# Patient Record
Sex: Female | Born: 2000 | Race: White | Hispanic: Yes | Marital: Single | Smoking: Never smoker
Health system: Southern US, Community
[De-identification: ages and names within clinical notes are randomized; demographics above are authoritative.]

## PROBLEM LIST (undated history)

## (undated) DIAGNOSIS — J45909 Unspecified asthma, uncomplicated: Secondary | ICD-10-CM

---
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
## 2022-12-09 IMAGING — DX DG CHEST 1V PORT
1 series · 2 of 2 positions shown · non-contrast
Comparison: 08/02/2015

CLINICAL DATA: Shortness of breath. Generalized chest pain. Head
feels heavy with burning sensation. Nausea and shortness of breath.
Positive COVID last week.

EXAM:
PORTABLE CHEST 1 VIEW

[Series 1: chest ap · 0.14mm/px · 2 of 2 slices shown]
[im 1/2]
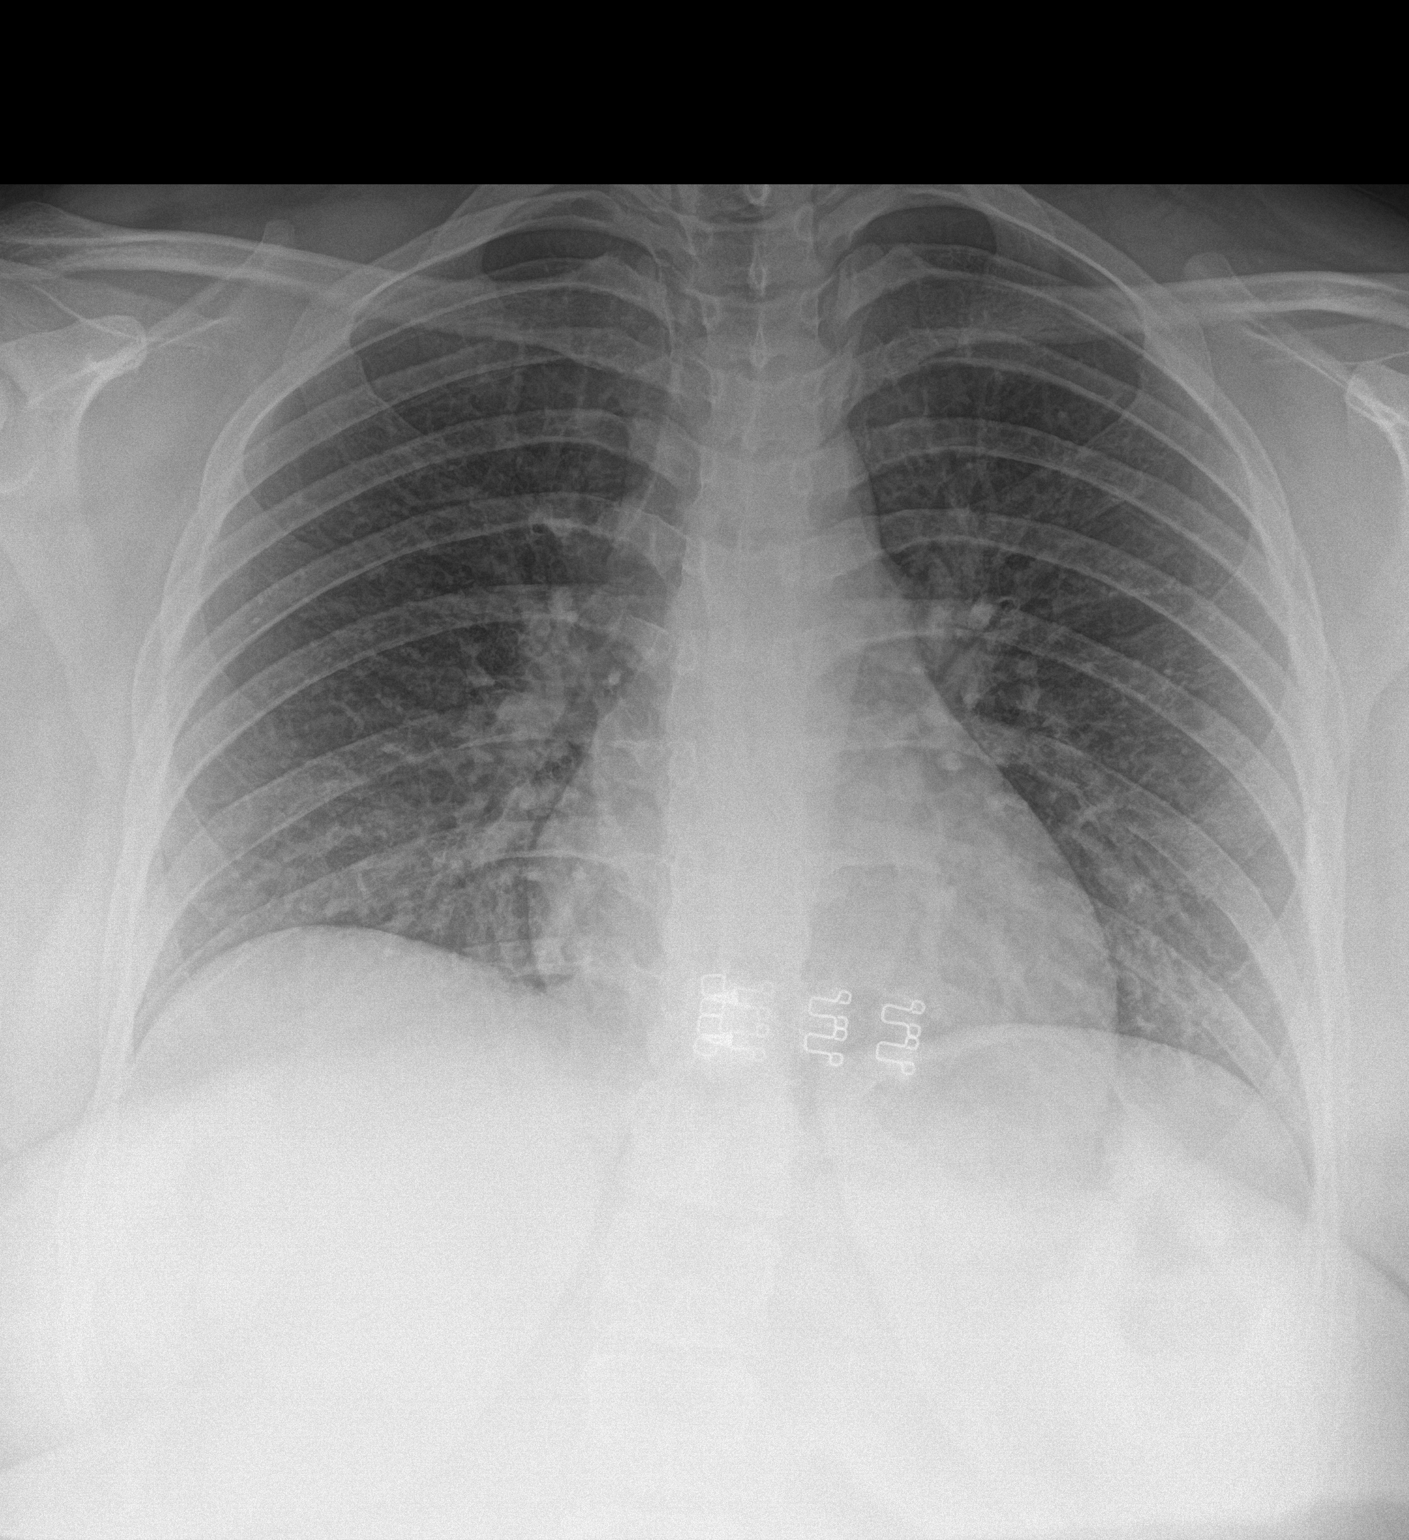
[im 2/2]
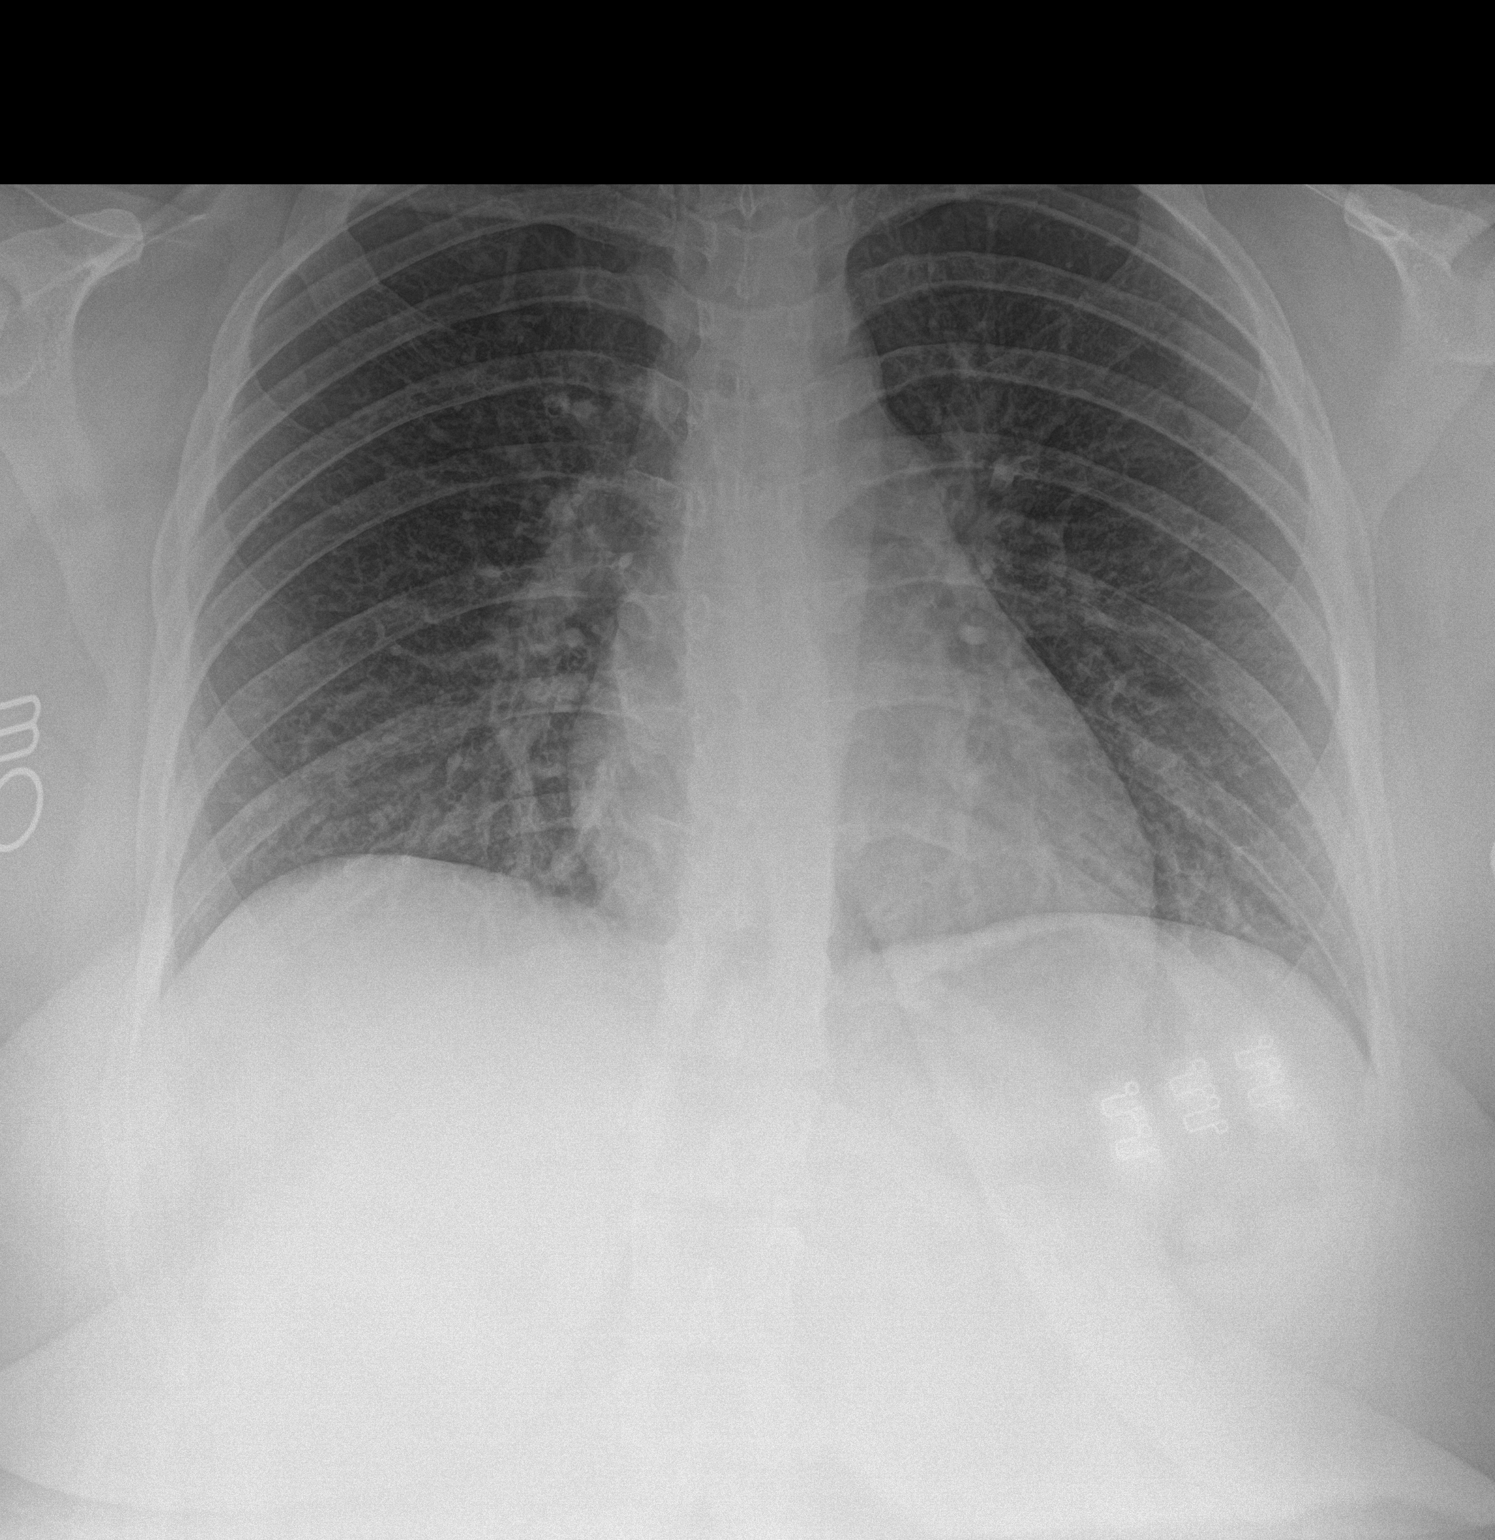

[2 of 2 positions shown; findings below may reference images not displayed]

FINDINGS: Heart size and pulmonary vascularity are normal. Mild interstitial
pattern to the lungs could represent multifocal pneumonia or edema.
No pleural effusions. No pneumothorax. Mediastinal contours appear
intact.
IMPRESSION: Mild interstitial pattern to the lungs could represent multifocal
pneumonia or edema.

## 2023-03-30 ENCOUNTER — Other Ambulatory Visit: Payer: Self-pay

## 2023-03-30 ENCOUNTER — Emergency Department (HOSPITAL_COMMUNITY)
Admission: EM | Admit: 2023-03-30 | Discharge: 2023-03-30 | Disposition: A | Discharge status: Home / Self Care | Payer: Medicaid Other | Source: Home / Self Care | Attending: Emergency Medicine | Admitting: Emergency Medicine

## 2023-03-30 ENCOUNTER — Encounter (HOSPITAL_COMMUNITY): Payer: Self-pay | Admitting: Emergency Medicine

## 2023-03-30 DIAGNOSIS — R519 Headache, unspecified: Secondary | ICD-10-CM | POA: Diagnosis present

## 2023-03-30 DIAGNOSIS — R000 Tachycardia, unspecified: Secondary | ICD-10-CM | POA: Insufficient documentation

## 2023-03-30 DIAGNOSIS — G43909 Migraine, unspecified, not intractable, without status migrainosus: Secondary | ICD-10-CM | POA: Insufficient documentation

## 2023-03-30 DIAGNOSIS — R030 Elevated blood-pressure reading, without diagnosis of hypertension: Secondary | ICD-10-CM | POA: Insufficient documentation

## 2023-03-30 LAB — CBC WITH DIFFERENTIAL/PLATELET
Abs Immature Granulocytes: 0.05 10*3/uL (ref 0.00–0.07)
Basophils Absolute: 0.1 10*3/uL (ref 0.0–0.1)
Basophils Relative: 0 %
Eosinophils Absolute: 0.3 10*3/uL (ref 0.0–0.5)
Eosinophils Relative: 2 %
HCT: 37 % (ref 36.0–46.0)
Hemoglobin: 10.8 g/dL — ABNORMAL LOW (ref 12.0–15.0)
Immature Granulocytes: 0 %
Lymphocytes Relative: 27 %
Lymphs Abs: 3 10*3/uL (ref 0.7–4.0)
MCH: 20.6 pg — ABNORMAL LOW (ref 26.0–34.0)
MCHC: 29.2 g/dL — ABNORMAL LOW (ref 30.0–36.0)
MCV: 70.5 fL — ABNORMAL LOW (ref 80.0–100.0)
Monocytes Absolute: 0.8 10*3/uL (ref 0.1–1.0)
Monocytes Relative: 7 %
Neutro Abs: 7.1 10*3/uL (ref 1.7–7.7)
Neutrophils Relative %: 64 %
Platelets: 412 10*3/uL — ABNORMAL HIGH (ref 150–400)
RBC: 5.25 MIL/uL — ABNORMAL HIGH (ref 3.87–5.11)
RDW: 17.7 % — ABNORMAL HIGH (ref 11.5–15.5)
WBC: 11.4 10*3/uL — ABNORMAL HIGH (ref 4.0–10.5)
nRBC: 0 % (ref 0.0–0.2)

## 2023-03-30 LAB — BASIC METABOLIC PANEL
Anion gap: 9 (ref 5–15)
BUN: 12 mg/dL (ref 6–20)
CO2: 21 mmol/L — ABNORMAL LOW (ref 22–32)
Calcium: 9.2 mg/dL (ref 8.9–10.3)
Chloride: 106 mmol/L (ref 98–111)
Creatinine, Ser: 0.57 mg/dL (ref 0.44–1.00)
GFR, Estimated: >60 mL/min (ref >60)
Glucose, Bld: 102 mg/dL — ABNORMAL HIGH (ref 70–99)
Potassium: 4.3 mmol/L (ref 3.5–5.1)
Sodium: 136 mmol/L (ref 135–145)

## 2023-03-30 MED ORDER — AMLODIPINE BESYLATE 5 MG PO TABS
5.0000 mg | ORAL_TABLET | Freq: Once | ORAL | Status: AC
  Administered 2023-03-30: 5 mg via ORAL
  Filled 2023-03-30: qty 1

## 2023-03-30 MED ORDER — KETOROLAC TROMETHAMINE 15 MG/ML IJ SOLN
15.0000 mg | Freq: Once | INTRAMUSCULAR | Status: AC
  Administered 2023-03-30: 15 mg via INTRAVENOUS
  Filled 2023-03-30: qty 1

## 2023-03-30 MED ORDER — DEXAMETHASONE SODIUM PHOSPHATE 10 MG/ML IJ SOLN
10.0000 mg | Freq: Once | INTRAMUSCULAR | Status: AC
  Administered 2023-03-30: 10 mg via INTRAVENOUS
  Filled 2023-03-30: qty 1

## 2023-03-30 MED ORDER — SODIUM CHLORIDE 0.9 % IV BOLUS: 500.0000 mL | Freq: Once | INTRAVENOUS | Status: DC

## 2023-03-30 MED ORDER — DIPHENHYDRAMINE HCL 50 MG/ML IJ SOLN
25.0000 mg | Freq: Once | INTRAMUSCULAR | Status: AC
  Administered 2023-03-30: 25 mg via INTRAVENOUS
  Filled 2023-03-30: qty 1

## 2023-03-30 MED ORDER — METOCLOPRAMIDE HCL 5 MG/ML IJ SOLN
10.0000 mg | Freq: Once | INTRAMUSCULAR | Status: AC
  Administered 2023-03-30: 10 mg via INTRAVENOUS
  Filled 2023-03-30: qty 2

## 2023-03-30 MED ORDER — SODIUM CHLORIDE 0.9 % IV BOLUS
1000.0000 mL | Freq: Once | INTRAVENOUS | Status: AC
  Administered 2023-03-30: 1000 mL via INTRAVENOUS

## 2023-03-30 NOTE — ED Triage Notes (Signed)
Pt c/o headache since 0200 unrelieved by aleve.

## 2023-03-30 NOTE — Discharge Instructions (Addendum)
Thank you for letting us take care of you today.  Your labs did not reveal a cause of your headaches. With your description of your headaches, I suspect you may have migraines or another chronic headache disorder. I have referred you to neurology for further evaluation and treatment. I also recommend establishing a primary care provider. I provided 2 clinics you may schedule this appointment with or you  may go to a PCP of your own choosing. See attached headache record. When you have headaches, record them as described and take this to your follow up appointment. There are also apps you may download with similar features.   Your blood pressure today was initially very elevated. It responded well with a small dose of a blood pressure medication. Since it is back to normal, I will not put you on blood pressure medication at this time but do recommend checking it a few times a week to see if it stays high frequently. You can check it at most pharmacies which have blood pressure machines or may purchase a blood pressure cuff at the pharmacy or online. See attached instructions for how to take your blood pressure. I would also keep a log of this to take to your follow up appointment with a primary care. If it remains elevated, they may want to place you on blood pressure medication.  For new or worsening symptoms including fever, chest pain, shortness of breath, severe uncontrolled headaches, uncontrollable vomiting, weakness on one side of your body, drooping of your face, vision changes, or other new, concerning symptoms, return to nearest ED for re-evaluation.

## 2023-03-30 NOTE — ED Notes (Signed)
Pt verbalized understanding of discharge instructions. Pt ambulated from the ed with a steady gait.

## 2023-03-30 NOTE — ED Provider Notes (Signed)
[place] EMERGENCY DEPARTMENT AT [facility] Provider Note   CSN: [id number] Arrival date & time: 03/30/23  6578     History  Chief Complaint  Patient presents with   Headache    Debra Fields is a 22 y.o. female with no PMH who presents to ED planing of right frontal headache since early this morning with associated photophobia.  She states that she gets these headaches frequently but has never been evaluated for them before.  Typically she will take Aleve for them and does so frequently but did not have any relief with that this morning. She was awake when the headache started but it has been severe enough that she has been unable to go to sleep since 2 AM.  No associated nausea, vomiting, chest pain, shortness of breath, paresthesias, visual changes, or focal weakness.  Denies chance of pregnancy.  Not currently followed by a primary care.  She is notably hypertensive in the ED today.  She states that she has no history of diagnosed hypertension.      Home Medications No daily medications  Allergies    Patient has no known allergies.    Review of Systems   Review of Systems  All other systems reviewed and are negative.   Physical Exam Updated Vital Signs BP 137/80 (BP Location: Left Arm)   Pulse (!) 110   Temp 98.5 F (36.9 C) (Oral)   Resp 18   SpO2 99%  Physical Exam Vitals and nursing note reviewed.  Constitutional:      General: She is not in acute distress.    Appearance: Normal appearance. She is not ill-appearing, toxic-appearing or diaphoretic.  HENT:     Head: Normocephalic and atraumatic.     Mouth/Throat:     Mouth: Mucous membranes are moist.  Eyes:     Extraocular Movements: Extraocular movements intact.     Right eye: No nystagmus.     Left eye: No nystagmus.     Conjunctiva/sclera: Conjunctivae normal.     Pupils: Pupils are equal, round, and reactive to light.  Cardiovascular:     Rate and Rhythm: Regular rhythm.  Tachycardia present.     Heart sounds: No murmur heard. Pulmonary:     Effort: Pulmonary effort is normal.     Breath sounds: Normal breath sounds.  Abdominal:     General: Abdomen is flat.     Palpations: Abdomen is soft.     Tenderness: There is no abdominal tenderness.  Musculoskeletal:        General: Normal range of motion.     Cervical back: Normal range of motion and neck supple. No rigidity.     Right lower leg: No edema.     Left lower leg: No edema.  Skin:    General: Skin is warm and dry.     Capillary Refill: Capillary refill takes less than 2 seconds.  Neurological:     Mental Status: She is alert and oriented to person, place, and time.     GCS: GCS eye subscore is 4. GCS verbal subscore is 5. GCS motor subscore is 6.     Cranial Nerves: Cranial nerves 2-12 are intact. No cranial nerve deficit, dysarthria or facial asymmetry.     Sensory: Sensation is intact.     Motor: Motor function is intact. No weakness, tremor, atrophy, abnormal muscle tone or seizure activity.     Coordination: Coordination is intact.  Psychiatric:        Behavior:  Behavior normal.     ED Results / Procedures / Treatments   Labs (all labs ordered are listed, but only abnormal results are displayed) Labs Reviewed  CBC WITH DIFFERENTIAL/PLATELET - Abnormal; Notable for the following components:      Result Value   WBC 11.4 (*)    RBC 5.25 (*)    Hemoglobin 10.8 (*)    MCV 70.5 (*)    MCH 20.6 (*)    MCHC 29.2 (*)    RDW 17.7 (*)    Platelets 412 (*)    All other components within normal limits  BASIC METABOLIC PANEL - Abnormal; Notable for the following components:   CO2 21 (*)    Glucose, Bld 102 (*)    All other components within normal limits  HCG, SERUM, QUALITATIVE    EKG None  Radiology No results found.  Procedures Procedures    Medications Ordered in ED Medications  ketorolac (TORADOL) 15 MG/ML injection 15 mg (15 mg Intravenous Given 03/30/23 0810)   metoCLOPramide (REGLAN) injection 10 mg (10 mg Intravenous Given 03/30/23 0809)  dexamethasone (DECADRON) injection 10 mg (10 mg Intravenous Given 03/30/23 0806)  diphenhydrAMINE (BENADRYL) injection 25 mg (25 mg Intravenous Given 03/30/23 0808)  sodium chloride 0.9 % bolus 1,000 mL (0 mLs Intravenous Stopped 03/30/23 0946)  amLODipine (NORVASC) tablet 5 mg (5 mg Oral Given 03/30/23 0805)    ED Course/ Medical Decision Making/ A&P                             Medical Decision Making Amount and/or Complexity of Data Reviewed Labs: ordered. Decision-making details documented in ED Course.  Risk Prescription drug management.   Medical Decision Making:   Debra Fields is a 22 y.o. female who presented to the ED today with headache detailed above.    Additional history discussed with patient's family/caregivers.  Complete initial physical exam performed, notably the patient was in no acute distress.  Nontoxic-appearing.  Mildly tachycardic with regular rhythm.  Neurologically intact.  No meningismus.    Reviewed and confirmed nursing documentation for past medical history, family history, social history.    Initial Assessment:   With the patient's presentation of headache, differential diagnosis includes but is not limited to emergent considerations for headache include subarachnoid hemorrhage, meningitis, temporal arteritis, glaucoma, cerebral ischemia, carotid/vertebral dissection, intracranial tumor, venous sinus thrombosis, carbon monoxide poisoning, acute or chronic subdural hemorrhage.  Other considerations include: migraine, cluster headache, hypertension, caffeine, alcohol, or drug withdrawal, pseudotumor cerebri, arteriovenous malformation, head injury, neurocysticercosis, post-lumbar puncture, preeclampsia in those assigned female at birth and in reproductive age, tension headache, viral vs acute bacterial sinusitis, cervical arthritis, refractive error causing strain,  temporomandibular joint syndrome, depression, somatoform disorder (eg, somatization), trigeminal neuralgia, glossopharyngeal neuralgia.  This is most consistent with an acute complicated illness  Initial Plan:  Screening labs including CBC and Metabolic panel to evaluate for infectious or metabolic etiology of disease.  Symptomatic management Objective evaluation as below reviewed   Initial Study Results:   Laboratory  All laboratory results reviewed without evidence of clinically relevant pathology.   Exceptions include: WBC 11.4, hemoglobin 10.8  Final Assessment and Plan:   22 year old female presents to the ED complaining of right frontal headache.  She has had these intermittently for years.  Not currently followed by an outpatient medical team.  Neurologically intact on exam.  No associated volume losses.  She is initially found to be tachycardic.  Upon  chart review, patient appears to typically be tachycardic between 95 to 110 bpm but has been up to the 140s before.  States that she has never been evaluated for this.  Is not complaining of acute chest pain, shortness of breath, or palpitations.  EKG with sinus tachycardia.  Stressed need for outpatient follow-up regarding this.  She is also initially hypertensive.  This improved to 137/80 with 5 mg of amlodipine.  Is unsure if her blood pressure is typically high.  As she did have fairly rapid decline in blood pressure with a small dose of antihypertensive, will allow her to monitor at home but stressed the importance of close outpatient follow-up for possible blood pressure control should it remain high in the coming weeks.  On labs, she has no significant electrolyte disturbance, no AKI.  Very minimal leukocytosis.  Hemoglobin 10.8.  States that she does have a history of iron deficiency anemia.  No active bleeding.  Afebrile.  No meningismus.  No recent infectious symptoms.  Patient's headache much improved with headache cocktail given in the  ED in addition to fluids.  Remains neurologically intact.  With history of frequent headaches, will refer to neurology for better headache control.  Patient to keep headache log until then.  Also provided with primary care follow-up for routine health maintenance as well as recheck of blood pressure.  Patient expressed understanding and agreement with plan.  Strict ED return precautions given, all questions answered, and stable for discharge.   Clinical Impression:  1. Migraine without status migrainosus, not intractable, unspecified migraine type   2. Elevated blood pressure reading without diagnosis of hypertension      Discharge           Final Clinical Impression(s) / ED Diagnoses Final diagnoses:  Migraine without status migrainosus, not intractable, unspecified migraine type  Elevated blood pressure reading without diagnosis of hypertension    Rx / DC Orders ED Discharge Orders          Ordered    Ambulatory referral to Neurology       Comments: An appointment is requested in approximately: 4 weeks   03/30/23 0943              Tonette Lederer, PA-C 03/30/23 1026    Laurence Spates, MD 03/30/23 [phone]

## 2023-09-01 ENCOUNTER — Encounter (HOSPITAL_COMMUNITY): Payer: Self-pay

## 2023-09-01 ENCOUNTER — Other Ambulatory Visit: Payer: Self-pay

## 2023-09-01 ENCOUNTER — Emergency Department (HOSPITAL_COMMUNITY)
Admission: EM | Admit: 2023-09-01 | Discharge: 2023-09-01 | Disposition: A | Discharge status: Home / Self Care | Payer: Medicaid Other | Attending: Emergency Medicine | Admitting: Emergency Medicine

## 2023-09-01 DIAGNOSIS — R000 Tachycardia, unspecified: Secondary | ICD-10-CM | POA: Diagnosis not present

## 2023-09-01 DIAGNOSIS — R519 Headache, unspecified: Secondary | ICD-10-CM

## 2023-09-01 DIAGNOSIS — D72829 Elevated white blood cell count, unspecified: Secondary | ICD-10-CM | POA: Insufficient documentation

## 2023-09-01 DIAGNOSIS — G44219 Episodic tension-type headache, not intractable: Secondary | ICD-10-CM | POA: Insufficient documentation

## 2023-09-01 DIAGNOSIS — R002 Palpitations: Secondary | ICD-10-CM | POA: Insufficient documentation

## 2023-09-01 DIAGNOSIS — J45909 Unspecified asthma, uncomplicated: Secondary | ICD-10-CM | POA: Insufficient documentation

## 2023-09-01 LAB — CBC
HCT: 41.1 % (ref 36.0–46.0)
Hemoglobin: 12.8 g/dL (ref 12.0–15.0)
MCH: 22.5 pg — ABNORMAL LOW (ref 26.0–34.0)
MCHC: 31.1 g/dL (ref 30.0–36.0)
MCV: 72.1 fL — ABNORMAL LOW (ref 80.0–100.0)
Platelets: 423 10*3/uL — ABNORMAL HIGH (ref 150–400)
RBC: 5.7 MIL/uL — ABNORMAL HIGH (ref 3.87–5.11)
RDW: 18 % — ABNORMAL HIGH (ref 11.5–15.5)
WBC: 13.1 10*3/uL — ABNORMAL HIGH (ref 4.0–10.5)
nRBC: 0 % (ref 0.0–0.2)

## 2023-09-01 LAB — COMPREHENSIVE METABOLIC PANEL
ALT: 16 U/L (ref 0–44)
AST: 19 U/L (ref 15–41)
Albumin: 3.9 g/dL (ref 3.5–5.0)
Alkaline Phosphatase: 90 U/L (ref 38–126)
Anion gap: 11 (ref 5–15)
BUN: 11 mg/dL (ref 6–20)
CO2: 19 mmol/L — ABNORMAL LOW (ref 22–32)
Calcium: 9.7 mg/dL (ref 8.9–10.3)
Chloride: 106 mmol/L (ref 98–111)
Creatinine, Ser: 0.61 mg/dL (ref 0.44–1.00)
GFR, Estimated: >60 mL/min (ref >60)
Glucose, Bld: 94 mg/dL (ref 70–99)
Potassium: 3.6 mmol/L (ref 3.5–5.1)
Sodium: 136 mmol/L (ref 135–145)
Total Bilirubin: 0.5 mg/dL (ref <1.2)
Total Protein: 8.3 g/dL — ABNORMAL HIGH (ref 6.5–8.1)

## 2023-09-01 LAB — ACETAMINOPHEN LEVEL: Acetaminophen (Tylenol), Serum: 14 ug/mL (ref 10–30)

## 2023-09-01 LAB — HCG, SERUM, QUALITATIVE: Preg, Serum: NEGATIVE

## 2023-09-01 LAB — D-DIMER, QUANTITATIVE: D-Dimer, Quant: <0.27 ug{FEU}/mL (ref 0.00–0.50)

## 2023-09-01 LAB — SALICYLATE LEVEL: Salicylate Lvl: 10.1 mg/dL (ref 7.0–30.0)

## 2023-09-01 MED ORDER — SODIUM CHLORIDE 0.9 % IV BOLUS
1000.0000 mL | Freq: Once | INTRAVENOUS | Status: AC
  Administered 2023-09-01: 1000 mL via INTRAVENOUS

## 2023-09-01 MED ORDER — KETOROLAC TROMETHAMINE 30 MG/ML IJ SOLN
30.0000 mg | Freq: Once | INTRAMUSCULAR | Status: AC
  Administered 2023-09-01: 30 mg via INTRAVENOUS
  Filled 2023-09-01: qty 1

## 2023-09-01 NOTE — ED Provider Notes (Incomplete)
Emergency Department Provider Note   I have reviewed the triage vital signs and the nursing notes.   HISTORY  Chief Complaint Tachycardia   HPI Debra Fields is a 22 y.o. female with past history of asthma presents the emergency department for evaluation of heart palpitations and tachycardia.  Symptoms began overnight.  She did take Fioricet for headache which she developed yesterday.  She states she frequently gets headaches but has never taken Fioricet in the past.  She does not use much caffeine.  She developed symptoms shortly afterwards.  She felt some associated lightheadedness like she was going to pass out.  No leg pain or swelling.  She does have hormone birth control implant in place. No PE or DVT history.   Past Medical History:  Diagnosis Date   Asthma     Review of Systems  Constitutional: No fever/chills Cardiovascular: Denies chest pain. Positive palpitations and lightheadedness.  Respiratory: Denies shortness of breath. Gastrointestinal: No abdominal pain.  No nausea, no vomiting.   Musculoskeletal: Negative for back pain. Skin: Negative for rash. Neurological: Positive HA.  ____________________________________________   PHYSICAL EXAM:  VITAL SIGNS: ED Triage Vitals  Encounter Vitals Group     BP 09/01/23 0708 (!) 159/89     Pulse Rate 09/01/23 0708 (!) 121     Resp 09/01/23 0708 18     Temp 09/01/23 0708 97.8 F (36.6 C)     Temp src --      SpO2 09/01/23 0708 98 %     Weight --      Height 09/01/23 0725 5\' 6"  (1.676 m)   Constitutional: Alert and oriented. Well appearing and in no acute distress. Eyes: Conjunctivae are normal.  Head: Atraumatic. Ears:  Healthy appearing ear canals and TMs bilaterally Nose: No congestion/rhinnorhea. Mouth/Throat: Mucous membranes are moist.  Oropharynx non-erythematous. Neck: No stridor.   Cardiovascular: Tachycardia. Good peripheral circulation. Grossly normal heart sounds.   Respiratory: Normal  respiratory effort.  No retractions. Lungs CTAB. Gastrointestinal: Soft and nontender. No distention.  Musculoskeletal: No lower extremity tenderness nor edema. No gross deformities of extremities. Neurologic:  Normal speech and language. No gross focal neurologic deficits are appreciated.  Skin:  Skin is warm, dry and intact. No rash noted.  ____________________________________________   LABS (all labs ordered are listed, but only abnormal results are displayed)  Labs Reviewed  CBC - Abnormal; Notable for the following components:      Result Value   WBC 13.1 (*)    RBC 5.70 (*)    MCV 72.1 (*)    MCH 22.5 (*)    RDW 18.0 (*)    Platelets 423 (*)    All other components within normal limits  COMPREHENSIVE METABOLIC PANEL - Abnormal; Notable for the following components:   CO2 19 (*)    Total Protein 8.3 (*)    All other components within normal limits  HCG, SERUM, QUALITATIVE  ACETAMINOPHEN LEVEL  SALICYLATE LEVEL  D-DIMER, QUANTITATIVE   ____________________________________________  EKG   EKG Interpretation Date/Time:  Monday September 01 2023 07:01:08 EST Ventricular Rate:  101 PR Interval:  132 QRS Duration:  94 QT Interval:  364 QTC Calculation: 471 R Axis:   97  Text Interpretation: Sinus tachycardia Rightward axis Borderline ECG When compared with ECG of 30-Mar-2023 10:10, PREVIOUS ECG IS PRESENT Confirmed by Alona Bene [phone]) on 09/01/2023 9:35:02 AM        ____________________________________________  RADIOLOGY  No results found.  ____________________________________________   PROCEDURES  Procedure(s) performed:   Procedures   ____________________________________________   INITIAL IMPRESSION / ASSESSMENT AND PLAN / ED COURSE  Pertinent labs & imaging results that were available during my care of the patient were reviewed by me and considered in my medical decision making (see chart for details).   This patient is Presenting for  Evaluation of palpitations, which does require a range of treatment options, and is a complaint that involves a high risk of morbidity and mortality.  The Differential Diagnoses includes but is not exclusive to acute coronary syndrome, aortic dissection, pulmonary embolism, cardiac tamponade, community-acquired pneumonia, pericarditis, musculoskeletal chest wall pain, etc.   Critical Interventions-    Medications  sodium chloride 0.9 % bolus 1,000 mL (has no administration in time range)  ketorolac (TORADOL) 30 MG/ML injection 30 mg (has no administration in time range)    Reassessment after intervention:     I did obtain Additional Historical Information from Mom at bedside.  Clinical Laboratory Tests Ordered, included CBC without anemia.  Leukocytosis to 13.1 noted.  Pregnancy negative.  No acute kidney injury.  LFTs normal.  Radiologic Tests: Considered neuroimaging/CT for headache symptoms but patient frequently has these type of headaches.  It was gradual in onset and has mostly resolved at this point.  No neurodeficits or red flag signs/symptoms.  Cardiac Monitor Tracing which shows sinus tachycardia.    Social Determinants of Health Risk patient is a non-smoker. Denies drug use.   Consult complete with  Medical Decision Making: Summary:  Patient presents to the emergency department for evaluation of heart palpitations and dizziness.  No specific chest pain or shortness of breath but arrives with sinus tachycardia in the 120s.  Could be a reaction/intolerance to Fioricet.  Also considering PE.  She is low risk by [place] but is on hormone birth control and arrives with tachycardia.  Unable to apply the [facility] rule in this case and will send a D-dimer.  Plan to treat with IV fluids and Toradol for headache and reassess.  Reevaluation with update and discussion with   ***Considered admission***  Patient's presentation is most consistent with acute presentation with potential threat  to life or bodily function.   Disposition:   ____________________________________________  FINAL CLINICAL IMPRESSION(S) / ED DIAGNOSES  Final diagnoses:  None     NEW OUTPATIENT MEDICATIONS STARTED DURING THIS VISIT:  New Prescriptions   No medications on file    Note:  This document was prepared using Dragon voice recognition software and may include unintentional dictation errors.  Alona Bene, MD, [facility]

## 2023-09-01 NOTE — ED Triage Notes (Signed)
Pt c.o her heart racing since she woke up this morning. Pt also c.o feeling dizzy with a headache. Denies chest pain or sob.

## 2023-09-01 NOTE — Discharge Instructions (Signed)
You have been seen in the Emergency Department (ED) for a headache and palpitations.  Please use Tylenol or Motrin as needed for symptoms, but only as written on the box.  As we have discussed, please follow up with your primary care doctor as soon as possible regarding today's Emergency Department (ED) visit and your headache symptoms.    Call your doctor or return to the ED if you have a worsening headache, sudden and severe headache, confusion, slurred speech, facial droop, weakness or numbness in any arm or leg, extreme fatigue, vision problems, or other symptoms that concern you.

## 2023-09-23 ENCOUNTER — Encounter: Payer: Self-pay | Admitting: Neurology

## 2023-09-23 ENCOUNTER — Ambulatory Visit (INDEPENDENT_AMBULATORY_CARE_PROVIDER_SITE_OTHER): Payer: Medicaid Other | Admitting: Neurology

## 2023-09-23 VITALS — BP 139/87 | HR 90 | Ht 65.0 in | Wt 239.0 lb

## 2023-09-23 DIAGNOSIS — E669 Obesity, unspecified: Secondary | ICD-10-CM

## 2023-09-23 DIAGNOSIS — R030 Elevated blood-pressure reading, without diagnosis of hypertension: Secondary | ICD-10-CM

## 2023-09-23 DIAGNOSIS — R519 Headache, unspecified: Secondary | ICD-10-CM | POA: Diagnosis not present

## 2023-09-23 DIAGNOSIS — G4489 Other headache syndrome: Secondary | ICD-10-CM

## 2023-09-23 NOTE — Patient Instructions (Addendum)
Here is what we discussed today and my recommendations to you: Establish care with a primary care provider. There are medical conditions that can cause headaches, such as high blood pressure, your blood pressure was borderline today. Get a full dilated eye exam. Strain on the eyes can cause headaches.  You are probably due for your eye examination for this year. We will do a brain scan, called MRI and call you with the test results. We will have to schedule you for this on a separate date. This test requires authorization from your insurance, and we will take care of the insurance process. I will order a sleep study for evaluation of possible sleep apnea. If you have sleep apnea, I will likely recommend treatment with an autoPAP machine.  Avoid taking Excedrin migraine.   Increase water intake, as dehydration can worsen headaches.  I recommend you try to drink about 4 bottles of water per day, 16.9 ounce size each. Limit your caffeine intake as you are to up to 2 servings per day, as excessive caffeine can perpetuate headaches. We will keep you posted as to your test results by phone call and plan to follow-up in this clinic accordingly.

## 2023-09-23 NOTE — Progress Notes (Signed)
Subjective:    Patient ID: Debra Fields is a 23 y.o. female.  HPI    Huston Foley, MD, PhD [facility] [address], [address] [address] [place], [place] [address]  I saw patient, Debra Fields, as a referral from the emergency room for evaluation of headaches.  The patient is unaccompanied today.  [name] is a 23 year old female with an underlying medical history of asthma, palpitations, and obesity, who reports recurrent headaches for months.  Symptoms started in or around October 2024.  She reports often waking up with a headache and nocturnal headaches.  She snores per family.  She has never had a sleep study.  Headaches are typically not associated with neurological accompaniments such as one-sided weakness or numbness or tingling or droopy face or slurring of speech.  She does not typically have nausea or vomiting or photophobia and sonophobia.  A maternal aunt was recently diagnosed with migraines as far she knows.  No family history of sleep apnea.  Her Epworth sleepiness score is 7 out of 24, fatigue severity score is 24 out of 63.  She had an eye examination with [facility] last year.  She has prescription eyeglasses.  She tried sumatriptan hand through another provider, she does not know the name of the provider.  She did not think it helped her headaches, she had some drowsiness from it.  She takes Tylenol as needed which is fairly effective.  Blood pressure has been elevated at times.  She has not seen her PCP.  She used to see a pediatrician but has not seen her PCP. She hydrates with water, estimates that she drinks about 3 bottles of water per day, she is a non-smoker and does not drink alcohol, she does not vape.  She drinks caffeine in the form of soda, usually 1 can per day.  She goes to bed around 10 or 11 and rise time is around 6:30 AM.  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 She lives  with her grandmother. She presented to the emergency room on 09/01/2023 with a chief complaint of tachycardia.  She also reported recurrent headaches at the time.  She had taken Fioricet and developed palpitations afterwards.  I reviewed the emergency room records.  Her blood pressure initially was elevated at 159/89 and she had tachycardia with a pulse rate of 121.  She was treated symptomatically with Toradol.  She had blood work including CBC, salicylate level and acetaminophen level, CMP and beta-hCG, labs were benign for the most part but MCV and MCH were below normal, platelets slightly elevated at 423, WBC mildly elevated at 13.1.  She had presented to the emergency room at [facility] on 03/30/2023 with a chief complaint of headache.  She was noted to be hypertensive in the ED with tachycardia noted.  She was treated symptomatically at the time with Toradol, Reglan, Decadron, Benadryl, and was given Norvasc.     Her Past Medical History Is Significant For: Past Medical History:  Diagnosis Date   Asthma     Her Past Surgical History Is Significant For: History reviewed. No pertinent surgical history.  Her Family History Is Significant For: History reviewed. No pertinent family history.  Her Social History Is Significant For: Social History   Socioeconomic History   Marital status: Single    Spouse name: Not on file   Number of children: Not on file   Years of education: Not on  file   Highest education level: Not on file  Occupational History   Not on file  Tobacco Use   Smoking status: Never   Smokeless tobacco: Never  Substance and Sexual Activity   Alcohol use: No   Drug use: No   Sexual activity: Never    Birth control/protection: Abstinence  Other Topics Concern   Not on file  Social History Narrative   Not on file   Social Drivers of Health   Financial Resource Strain: Not on file  Food Insecurity: Not on file  Transportation Needs: Not on file  Physical  Activity: Not on file  Stress: Not on file  Social Connections: Not on file    Her Allergies Are:  No Known Allergies:   Her Current Medications Are:  Outpatient Encounter Medications as of 09/23/2023  Medication Sig   acetaminophen (TYLENOL) 325 MG tablet Take 650 mg by mouth every 6 (six) hours as needed.   aspirin-acetaminophen-caffeine (EXCEDRIN MIGRAINE) 250-250-65 MG tablet Take 1 tablet by mouth every 6 (six) hours as needed for headache.   No facility-administered encounter medications on file as of 09/23/2023.  :   Review of Systems:  Out of a complete 14 point review of systems, all are reviewed and negative with the exception of these symptoms as listed below:  Review of Systems  Neurological:        Patient in room #9 and alone. Patient states she been having very bad headaches for the pass four months. Patient states the pain starts at her left ear then travels up to her head.    Objective:  Neurological Exam  Physical Exam Physical Examination:   Vitals:   09/23/23 1354  BP: 139/87  Pulse: 90    General Examination: The patient is a very pleasant 23 y.o. female in no acute distress. She appears towell-developed and well-nourished and well groomed.   HEENT: Normocephalic, atraumatic, pupils are equal, round and reactive to light, no photophobia.  Funduscopic exam benign.  Corrective eyeglasses in place.  Extraocular tracking is good without limitation to gaze excursion or nystagmus noted. Hearing is grossly intact. Face is symmetric with normal facial animation and normal facial sensation to light touch, temperature and vibration sense. Speech is clear with no dysarthria noted. There is no hypophonia. There is no lip, neck/head, jaw or voice tremor. Neck is supple with full range of passive and active motion. There are no carotid bruits on auscultation. Oropharynx exam reveals: mild mouth dryness, good dental hygiene and moderate airway crowding, due to small  airway, tonsillar size of 1-2+ on the right and 1+ on the left. Mallampati is class III. Tongue protrudes centrally and palate elevates symmetrically.  Neck circumference 16-5/8 inches.    Chest: Clear to auscultation without wheezing, rhonchi or crackles noted.  Heart: S1+S2+0, regular and normal without murmurs, rubs or gallops noted.   Abdomen: Soft, non-tender and non-distended.  Extremities: There is no pitting edema in the distal lower extremities bilaterally.   Skin: Warm and dry without trophic changes noted.   Musculoskeletal: exam reveals no obvious joint deformities.   Neurologically:  Mental status: The patient is awake, alert and oriented in all 4 spheres. Her immediate and remote memory, attention, language skills and fund of knowledge are appropriate. There is no evidence of aphasia, agnosia, apraxia or anomia. Speech is clear with normal prosody and enunciation. Thought process is linear. Mood is normal and affect is normal.  Cranial nerves II - XII are as described above  under HEENT exam.  Motor exam: Normal bulk, strength and tone is noted. There is no obvious action or resting tremor.  No drift or rebound. Fine motor skills and coordination: Intact taps, hand movements and rapid alternating patting in both upper extremities, normal foot taps bilaterally in the lower extremities.  Cerebellar testing: No dysmetria or intention tremor. There is no truncal or gait ataxia.  Normal finger-to-nose, normal heel-to-shin bilaterally. Sensory exam: intact to light touch, temperature and vibration sense in the upper and lower extremities.   Romberg is negative.    Reflexes 1+ throughout, toes are downgoing bilaterally.    Gait, station and balance: She stands easily. No veering to one side is noted. No leaning to one side is noted. Posture is age-appropriate and stance is narrow based. Gait shows normal stride length and normal pace. No problems turning are noted.  Normal tandem  walk.  Assessment and Plan:  In summary, Kalila Naro is a very pleasant 23 y.o.-year old female with an underlying medical history of asthma, palpitations, and obesity, who presents for evaluation of her recurrent headaches of approximately 3 months duration.  Neurological exam is nonfocal and she is largely reassured in this regard, headache description not very classic for migraines but migrainous headaches could be a component, underlying sleep disordered breathing is also a possibility, strain on the eyes as well as undiagnosed hypertension also possibilities.  She is advised about these possibilities and advised to establish care with a new PCP if she needs to.  She is advised to proceed with further testing through our office including a brain MRI with and without contrast to rule out a structural cause of her symptoms and a sleep study to look for the possibility of underlying sleep apnea.  If she has obstructive sleep apnea I will likely recommend treatment with a home AutoPap machine or CPAP therapy.  She is advised to stay well-hydrated with water, she is advised to limit her caffeine and avoid taking Excedrin Migraine.  She is advised that strain on the eyes can cause headaches as well as elevated blood pressure and undiagnosed hypertension.  This is why it is important that she establish care with a PCP as there are medical causes of recurrent headaches.  We will keep her posted as to her test results by phone call and plan to follow-up in this clinic accordingly.  This was an extended visit of over 60 minutes with extended chart review involved with electronic review of her chart including ER visits.  There was considerable counseling and coordination of care also involved.  I answered all her questions today and she was in agreement with our approach.     Huston Foley, MD, PhD

## 2023-09-24 ENCOUNTER — Telehealth: Payer: Self-pay | Admitting: Neurology

## 2023-09-24 NOTE — Telephone Encounter (Signed)
NPSG MCD [facility] pending

## 2023-09-25 NOTE — Telephone Encounter (Signed)
Checked the status on the portal it is still pending.  

## 2023-09-29 ENCOUNTER — Telehealth: Payer: Self-pay | Admitting: Neurology

## 2023-09-29 NOTE — Telephone Encounter (Signed)
[facility] Harvey: [id number] exp. 09/29/23-11/28/23 sent to GI [phone]

## 2023-09-29 NOTE — Telephone Encounter (Signed)
Checked status on the portal it is still pending.

## 2023-10-06 NOTE — Telephone Encounter (Signed)
 Checked status on the portal it is still pending.

## 2023-10-09 NOTE — Telephone Encounter (Signed)
 NPSG MCD [facility]: [id number] (exp. 09/24/23 to 01/04/24)

## 2023-10-09 NOTE — Telephone Encounter (Signed)
 I spoke with the patient.  NPSG MCD [facility]: [id number] (exp. 09/24/23 to 01/04/24)   She is scheduled at [facility] for 10/14/23 at 8 pm.  Mailed packet to the patient & sent email.

## 2023-10-09 NOTE — Telephone Encounter (Signed)
 I spoke with the patient.  NPSG MCD [facility]: [id number] (exp. 09/24/23 to 01/04/24)   She is scheduled at [facility]

## 2023-10-14 ENCOUNTER — Ambulatory Visit (INDEPENDENT_AMBULATORY_CARE_PROVIDER_SITE_OTHER): Payer: Medicaid Other | Admitting: Neurology

## 2023-10-14 DIAGNOSIS — R0683 Snoring: Secondary | ICD-10-CM | POA: Diagnosis not present

## 2023-10-14 DIAGNOSIS — G4489 Other headache syndrome: Secondary | ICD-10-CM

## 2023-10-14 DIAGNOSIS — E669 Obesity, unspecified: Secondary | ICD-10-CM

## 2023-10-14 DIAGNOSIS — R030 Elevated blood-pressure reading, without diagnosis of hypertension: Secondary | ICD-10-CM

## 2023-10-14 DIAGNOSIS — R519 Headache, unspecified: Secondary | ICD-10-CM

## 2023-10-14 DIAGNOSIS — G4720 Circadian rhythm sleep disorder, unspecified type: Secondary | ICD-10-CM

## 2023-10-23 NOTE — Procedures (Unsigned)
Physician Interpretation:    Referred by: ER   History and Indication for Testing: 23 year old female with an underlying medical history of asthma, palpitations, and obesity, who reports recurrent headaches for months. She snores, per family. Her Epworth sleepiness score is 7 out of 24, fatigue severity score is 24 out of 63.    EEG: Review of the EEG showed no abnormal electrical discharges and symmetrical bihemispheric findings.     EKG: The EKG revealed normal sinus rhythm (NSR). ***   AUDIO/VIDEO REVIEW: The audio and video review did not show any abnormal or unusual behaviors, movements, phonations or vocalizations. The patient took no restroom breaks. Snoring was noted, intermittently, in the mild to moderate range.   POST-STUDY QUESTIONNAIRE: Post study, the patient indicated, that sleep was the same as usual.    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 Mild intermittent snoring was noted, at times in the moderate range. Treatment with a positive airway pressure device, such as CPAP or autoPAP is not indicated. Weight loss and avoidance of the supine sleep position may aid in reducing her snoring.  This study shows minimal sleep fragmentation and mildly abnormal sleep stage percentages; these are nonspecific findings and per se do not signify an intrinsic sleep disorder or a cause for the patient's sleep-related symptoms. Causes include (but are not limited to) the first night effect of the sleep study, circadian rhythm disturbances, medication effect or an underlying mood disorder or medical problem.  The patient should be cautioned not to drive, work at heights, or operate dangerous or heavy equipment  when tired or sleepy. Review and reiteration of good sleep hygiene measures should be pursued with any patient. The patient will be advised to follow up with the referring provider, who will be notified of the test results.     I certify that I have reviewed the entire raw data recording prior to the issuance of this report in accordance with the Standards of Accreditation of the American Academy of Sleep Medicine (AASM).   Huston Foley, MD, PhD Medical Director, [facility]) Diplomat, ABPN (Neurology and Sleep)   Technical Report:   General Information  Name: Debra Fields, Debra Fields BMI: 13.08 Physician: Huston Foley, MD  ID: [id number] Height: 65.0 in Technician: Margaretann Loveless, RPSGT  Sex: Female Weight: 239.0 lb Record: [id number]  Age: 67 [25-Nov-2000] Date: 10/14/2023    Medical & Medication History    23 year old female with an underlying medical history of asthma, palpitations, and obesity, who reports recurrent headaches for months. Symptoms started in or around October 2024. She reports often waking up with a headache and nocturnal headaches. She snores per family. She has never had a sleep study. Headaches are typically not associated with neurological accompaniments such as one-sided weakness or numbness or tingling or droopy face or slurring of speech. She does not typically have nausea or vomiting or photophobia and sonophobia. She had presented to the emergency room at [facility] on 03/30/2023 with a chief complaint of headache. She was noted to be hypertensive in the ED with tachycardia noted. She was treated symptomatically at the time with Toradol, Reglan, Decadron, Benadryl, and was given Norvasc.  Tylenol, Excedrin Migraine   Sleep Disorder  Comments   The patient came into the sleep lab for a PSG. No restroom breaks. EKG kept in NSR with tachycardia. The patient has known cardiac history. Mild to moderate snoring.  Respiratory events scored with a 4% desat. The patient slept supine and lateral. AHI was 4.2 after 2 hrs of TST.     Lights out: 10:17:21 PM Lights on: 05:10:14 AM   Time Total Supine Side Prone Upright  Recording (TRT) 6h 53.16m 3h 7.39m 3h 45.63m 0h 0.43m 0h 0.33m  Sleep (TST) 6h 16.39m 2h 54.57m 3h 22.86m 0h 0.79m 0h 0.57m   Latency N1 N2 N3 REM Onset Per. Slp. Eff.  Actual 0h 0.85m 0h 8.54m 0h 36.57m 1h 21.41m 0h 13.14m 0h 18.59m 91.16%   Stg Dur Wake N1 N2 N3 REM  Total 36.5 8.5 220.0 61.0 87.0  Supine 13.0 1.0 96.5 57.5 19.5  Side 23.5 7.5 123.5 3.5 67.5  Prone 0.0 0.0 0.0 0.0 0.0  Upright 0.0 0.0 0.0 0.0 0.0   Stg % Wake N1 N2 N3 REM  Total 8.8 2.3 58.4 16.2 23.1  Supine 3.1 0.3 25.6 15.3 5.2  Side 5.7 2.0 32.8 0.9 17.9  Prone 0.0 0.0 0.0 0.0 0.0  Upright 0.0 0.0 0.0 0.0 0.0     Apnea Summary Sub Supine Side Prone Upright  Total 1 Total 1 0 1 0 0    REM 1 0 1 0 0    NREM 0 0 0 0 0  Obs 0 REM 0 0 0 0 0    NREM 0 0 0 0 0  Mix 0 REM 0 0 0 0 0    NREM 0 0 0 0 0  Cen 1 REM 1 0 1 0 0    NREM 0 0 0 0 0   Rera Summary Sub Supine Side Prone Upright  Total 0 Total 0 0 0 0 0    REM 0 0 0 0 0    NREM 0 0 0 0 0   Hypopnea Summary Sub Supine Side Prone Upright  Total 26 Total 26 21 5  0 0    REM 10 5 5  0 0    NREM 16 16 0 0 0   4% Hypopnea Summary Sub Supine Side Prone Upright  Total (4%) 26 Total 26 21 5  0 0    REM 10 5 5  0 0    NREM 16 16 0 0 0     AHI Total Obs Mix Cen  4.30 Apnea 0.16 0.00 0.00 0.16   Hypopnea 4.14 -- -- --  4.30 Hypopnea (4%) 4.14 -- -- --    Total Supine Side Prone Upright  Position AHI 4.30 7.22 1.78 0.00 0.00  REM AHI 7.59   NREM AHI 3.32   Position RDI 4.30 7.22 1.78 0.00 0.00  REM RDI 7.59   NREM RDI 3.32    4% Hypopnea Total Supine Side Prone Upright  Position AHI (4%) 4.30 7.22 1.78 0.00 0.00  REM AHI (4%) 7.59   NREM AHI (4%) 3.32   Position RDI (4%) 4.30 7.22 1.78 0.00 0.00  REM RDI (4%) 7.59   NREM RDI (4%) 3.32    Desaturation  Information Threshold: 2% <100% <90% <80% <70% <60% <50% <40%  Supine 102.0 2.0 0.0 0.0 0.0 0.0 0.0  Side 106.0 1.0 0.0 0.0 0.0 0.0 0.0  Prone 0.0 0.0 0.0 0.0 0.0 0.0 0.0  Upright 0.0 0.0 0.0 0.0 0.0 0.0 0.0  Total 208.0 3.0 0.0 0.0 0.0 0.0  0.0  Index 31.2 0.5 0.0 0.0 0.0 0.0 0.0   Threshold: 3% <100% <90% <80% <70% <60% <50% <40%  Supine 47.0 2.0 0.0 0.0 0.0 0.0 0.0  Side 28.0 1.0 0.0 0.0 0.0 0.0 0.0  Prone 0.0 0.0 0.0 0.0 0.0 0.0 0.0  Upright 0.0 0.0 0.0 0.0 0.0 0.0 0.0  Total 75.0 3.0 0.0 0.0 0.0 0.0 0.0  Index 11.3 0.5 0.0 0.0 0.0 0.0 0.0   Threshold: 4% <100% <90% <80% <70% <60% <50% <40%  Supine 22.0 2.0 0.0 0.0 0.0 0.0 0.0  Side 8.0 1.0 0.0 0.0 0.0 0.0 0.0  Prone 0.0 0.0 0.0 0.0 0.0 0.0 0.0  Upright 0.0 0.0 0.0 0.0 0.0 0.0 0.0  Total 30.0 3.0 0.0 0.0 0.0 0.0 0.0  Index 4.5 0.5 0.0 0.0 0.0 0.0 0.0   Threshold: 3% <100% <90% <80% <70% <60% <50% <40%  Supine 47 2 0 0 0 0 0  Side 28 1 0 0 0 0 0  Prone 0 0 0 0 0 0 0  Upright 0 0 0 0 0 0 0  Total 75 3 0 0 0 0 0   Awakening/Arousal Information # of Awakenings 24  Wake after sleep onset 23.27m  Wake after persistent sleep 20.66m   Arousal Assoc. Arousals Index  Apneas 0 0.0  Hypopneas 3 0.5  Leg Movements 0 0.0  Snore 0 0.0  PTT Arousals 0 0.0  Spontaneous 42 6.7  Total 45 7.2  Leg Movement Information PLMS LMs Index  Total LMs during PLMS 0 0.0  LMs w/ Microarousals 0 0.0   LM LMs Index  w/ Microarousal 0 0.0  w/ Awakening 0 0.0  w/ Resp Event 0 0.0  Spontaneous 1 0.2  Total 1 0.2     Desaturation threshold setting: 3% Minimum desaturation setting: 10 seconds SaO2 nadir: 84% The longest event was a 38 sec obstructive Hypopnea with a minimum SaO2 of 94%. The lowest SaO2 was 84% associated with a 14 sec central Apnea. EKG Rates EKG Avg Max Min  Awake 96 133 81  Asleep 91 129 75  EKG Events: Tachycardia

## 2023-10-28 ENCOUNTER — Telehealth: Payer: Self-pay | Admitting: *Deleted

## 2023-10-28 NOTE — Telephone Encounter (Signed)
 Spoke to patient gave sleep study results Made pt headache f/u visit 01/2024 Pt thanked me for calling

## 2023-10-28 NOTE — Telephone Encounter (Signed)
-----   Message from Huston Foley sent at 10/24/2023 12:48 PM EST ----- Patient was referred by ED for headaches. She had a PSG on 10/14/23, which was benign. Please inform patient, that she does not need CPAP therapy. Please offer FU appt with NP or myself in about 3-4 mo for HA FU. SA

## 2023-10-29 ENCOUNTER — Encounter: Payer: Self-pay | Admitting: Neurology

## 2023-11-04 ENCOUNTER — Inpatient Hospital Stay: Admission: RE | Admit: 2023-11-04 | Payer: Medicaid Other | Source: Ambulatory Visit

## 2024-01-05 ENCOUNTER — Ambulatory Visit: Payer: Medicaid Other | Admitting: Neurology

## 2024-01-05 ENCOUNTER — Encounter: Payer: Self-pay | Admitting: Neurology
